# Patient Record
Sex: Male | Born: 1980 | Race: White | Hispanic: No | Marital: Married | Smoking: Never smoker
Health system: Southern US, Community
[De-identification: ages and names within clinical notes are randomized; demographics above are authoritative.]

## PROBLEM LIST (undated history)

## (undated) DIAGNOSIS — M1990 Unspecified osteoarthritis, unspecified site: Secondary | ICD-10-CM

## (undated) HISTORY — PX: COLONOSCOPY: SHX174

## (undated) HISTORY — PX: WISDOM TOOTH EXTRACTION: SHX21

## (undated) HISTORY — PX: VASECTOMY: SHX75

## (undated) HISTORY — PX: TONSILLECTOMY: SUR1361

---

## 2016-05-06 DIAGNOSIS — L299 Pruritus, unspecified: Secondary | ICD-10-CM | POA: Insufficient documentation

## 2016-05-06 DIAGNOSIS — J3089 Other allergic rhinitis: Secondary | ICD-10-CM | POA: Insufficient documentation

## 2016-05-06 DIAGNOSIS — T7840XA Allergy, unspecified, initial encounter: Secondary | ICD-10-CM | POA: Insufficient documentation

## 2016-05-06 DIAGNOSIS — T783XXA Angioneurotic edema, initial encounter: Secondary | ICD-10-CM | POA: Insufficient documentation

## 2016-12-17 ENCOUNTER — Ambulatory Visit
Admission: EM | Admit: 2016-12-17 | Discharge: 2016-12-17 | Disposition: A | Discharge status: Home / Self Care | Payer: BLUE CROSS/BLUE SHIELD | Attending: Family Medicine | Admitting: Family Medicine

## 2016-12-17 ENCOUNTER — Ambulatory Visit
Admission: RE | Admit: 2016-12-17 | Discharge: 2016-12-17 | Disposition: A | Discharge status: Home / Self Care | Payer: BLUE CROSS/BLUE SHIELD | Source: Ambulatory Visit | Attending: Family Medicine | Admitting: Family Medicine

## 2016-12-17 ENCOUNTER — Encounter: Payer: Self-pay | Admitting: Gynecology

## 2016-12-17 ENCOUNTER — Ambulatory Visit (INDEPENDENT_AMBULATORY_CARE_PROVIDER_SITE_OTHER): Payer: BLUE CROSS/BLUE SHIELD

## 2016-12-17 DIAGNOSIS — S20211A Contusion of right front wall of thorax, initial encounter: Secondary | ICD-10-CM

## 2016-12-17 DIAGNOSIS — R0782 Intercostal pain: Secondary | ICD-10-CM

## 2016-12-17 DIAGNOSIS — W19XXXA Unspecified fall, initial encounter: Secondary | ICD-10-CM | POA: Diagnosis not present

## 2016-12-17 DIAGNOSIS — R0789 Other chest pain: Secondary | ICD-10-CM

## 2016-12-17 MED ORDER — HYDROCODONE-ACETAMINOPHEN 5-325 MG PO TABS: ORAL_TABLET | ORAL | 0 refills | Status: DC

## 2016-12-17 NOTE — ED Provider Notes (Signed)
[facility]    CSN: [id number] Arrival date & time: 12/17/16  1608     History   Chief Complaint Chief Complaint  Patient presents with  . Rib Injury    HPI Jerry Henry is a 36 y.o. male.   36 yo male with a c/o right sided rib/chest pain after falling and injuring playing ball about 5 days ago. States pain is worse with movement or deep breathing.    The history is provided by the patient.    History reviewed. No pertinent past medical history.  There are no active problems to display for this patient.   Past Surgical History:  Procedure Laterality Date  . COLONOSCOPY    . TONSILLECTOMY    . WISDOM TOOTH EXTRACTION         Home Medications    Prior to Admission medications   Medication Sig Start Date End Date Taking? Authorizing Provider  HYDROcodone-acetaminophen (NORCO/VICODIN) 5-325 MG tablet 1-2 tabs po bid prn 12/17/16   Payton Mccallum, MD    Family History No family history on file.  Social History Social History  Substance Use Topics  . Smoking status: Never Smoker  . Smokeless tobacco: Never Used  . Alcohol use Yes     Allergies   Penicillins   Review of Systems Review of Systems   Physical Exam Triage Vital Signs ED Triage Vitals  Enc Vitals Group     BP 12/17/16 1637 131/83     Pulse Rate 12/17/16 1637 78     Resp 12/17/16 1637 16     Temp 12/17/16 1637 98.8 F (37.1 C)     Temp Source 12/17/16 1637 Oral     SpO2 12/17/16 1637 99 %     Weight 12/17/16 1634 210 lb (95.3 kg)     Height 12/17/16 1634 6' (1.829 m)     Head Circumference --      Peak Flow --      Pain Score 12/17/16 1634 7     Pain Loc --      Pain Edu? --      Excl. in GC? --    No data found.   Updated Vital Signs BP 131/83 (BP Location: Left Arm)   Pulse 78   Temp 98.8 F (37.1 C) (Oral)   Resp 16   Ht 6' (1.829 m)   Wt 210 lb (95.3 kg)   SpO2 99%   BMI 28.48 kg/m   Visual Acuity Right Eye Distance:   Left Eye Distance:     Bilateral Distance:    Right Eye Near:   Left Eye Near:    Bilateral Near:     Physical Exam  Constitutional: He appears well-developed and well-nourished.  Neck: No tracheal deviation present.  Cardiovascular: Normal rate, regular rhythm, normal heart sounds and intact distal pulses.   No murmur heard. Pulmonary/Chest: Effort normal and breath sounds normal. No stridor. No respiratory distress. He has no wheezes. He has no rales. He exhibits tenderness (over right anterior chest wall).  Skin: He is not diaphoretic.  Nursing note and vitals reviewed.    UC Treatments / Results  Labs (all labs ordered are listed, but only abnormal results are displayed) Labs Reviewed - No data to display  EKG  EKG Interpretation None       Radiology Dg Ribs Unilateral W/chest Right  Result Date: 12/17/2016 CLINICAL DATA:  Larey Seat 5 nights ago playing baseball and tripped over chair landing on right side of chest.  EXAM: RIGHT RIBS AND CHEST - 3+ VIEW COMPARISON:  None. FINDINGS: No fracture or other bone lesions are seen involving the ribs. There is no evidence of pneumothorax or pleural effusion. Both lungs are clear. Heart size and mediastinal contours are within normal limits. IMPRESSION: No acute displaced rib fracture.  Clear lungs. Electronically Signed   By: Tollie Ethavid  Kwon M.D.   On: 12/17/2016 17:39    Procedures Procedures (including critical care time)  Medications Ordered in UC Medications - No data to display   Initial Impression / Assessment and Plan / UC Course  I have reviewed the triage vital signs and the nursing notes.  Pertinent labs & imaging results that were available during my care of the patient were reviewed by me and considered in my medical decision making (see chart for details).       Final Clinical Impressions(s) / UC Diagnoses   Final diagnoses:  Contusion of right chest wall, initial encounter    New Prescriptions Discharge Medication List as of  12/17/2016  5:45 PM    START taking these medications   Details  HYDROcodone-acetaminophen (NORCO/VICODIN) 5-325 MG tablet 1-2 tabs po bid prn, Print       1. x-ray result (negative for acute injury) and diagnosis reviewed with patient 2. rx as per orders above; reviewed possible side effects, interactions, risks and benefits  3. Recommend supportive treatment with rest, ice, otc analgesics prn 4. Follow-up prn if symptoms worsen or don't improve   Payton Mccallumonty, Talor Desrosiers, MD 12/17/16 1827

## 2016-12-17 NOTE — ED Triage Notes (Signed)
Per patient playing base ball x 5 days when he fell and injury right side rib. Per patient right side rib pain.

## 2017-03-24 DIAGNOSIS — H60501 Unspecified acute noninfective otitis externa, right ear: Secondary | ICD-10-CM | POA: Insufficient documentation

## 2017-05-17 ENCOUNTER — Ambulatory Visit (INDEPENDENT_AMBULATORY_CARE_PROVIDER_SITE_OTHER): Payer: BLUE CROSS/BLUE SHIELD

## 2017-05-17 ENCOUNTER — Ambulatory Visit (INDEPENDENT_AMBULATORY_CARE_PROVIDER_SITE_OTHER): Payer: BLUE CROSS/BLUE SHIELD | Admitting: Podiatry

## 2017-05-17 VITALS — BP 118/78 | HR 72 | Ht 72.0 in | Wt 200.0 lb

## 2017-05-17 DIAGNOSIS — M109 Gout, unspecified: Secondary | ICD-10-CM | POA: Diagnosis not present

## 2017-05-17 LAB — URIC ACID: Uric Acid, Serum: 7.5 mg/dL (ref 4.0–8.0)

## 2017-05-17 NOTE — Progress Notes (Signed)
   Subjective:    Patient ID: Jerry Henry, male    DOB: 10/04/1980, 36 y.o.   MRN: [id number] Chief Complaint  Patient presents with  . Gout    right foot /great toe swollen and very painful -had first episode of gout at 20. Flared up again this past summer.Was treated at a different office - issue has not resolved       Review of Systems  All other systems reviewed and are negative.      Objective:   Physical Exam: Vital signs are stable he is alert and oriented x3 I have reviewed his past medical history medications allergy surgery and social history.  Pulses are strong palpable neurologic sensorium is intact.  Deep tendon reflexes are intact.  Muscle strength +5/5 dorsiflexors plantar flexors inverters and everters onto the musculature is intact and symmetrical bilateral.  Orthopedic evaluation does demonstrate limitation on range of motion of the first metatarsophalangeal joint of the right foot with mild erythema to the medial border which appears to be hypertrophic medial condyle.  It is mildly tender on palpation.  States is a history of gout.  Joint space narrowing is noted on radiographs some subchondral sclerosis is noted and dorsal spurring is noted.  Cutaneous evaluation no open lesions or wounds.  No tophi noted.        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  Plan: Today simply were going to try to get a baseline uric acid level send him out for uric acid to be drawn.  Follow-up with him in an as-needed basis.  He was instructed to follow-up with a doctor or to at the very least obtain a requisition for uric acid drawl should he develop another gout flare.

## 2017-10-19 ENCOUNTER — Other Ambulatory Visit: Payer: Self-pay | Admitting: Orthopedic Surgery

## 2017-10-21 NOTE — Pre-Procedure Instructions (Signed)
Victorhugo Preis  10/21/2017      [facility] - [place], [place] - [address] [place] [address] Phone: [phone] Fax: [phone]    Your procedure is scheduled on Oct 27, 2017.  Report to [facility] Admitting at 830 AM.  Call this number if you have problems the morning of surgery:[phone]   Remember:  No food or drink after midnight, Oct 26, 2017.  Continue all medications as directed by your physician except follow these medication instructions before surgery below    Take these medicines the morning of surgery with A SIP OF WATER  Cetirizine (zyrtec) Hydrocodone-acetaminophen (norco)-if needed prednisone  7 days prior to surgery STOP taking any Aspirin (unless otherwise instructed by your surgeon), Aleve, Naproxen, Ibuprofen, Motrin, Advil, Goody's, BC's, all herbal medications, fish oil, and all vitamins    Do not wear jewelry, make-up or nail polish.  Do not wear lotions, powders, or perfumes, or deodorant.  Men may shave face and neck.  Do not bring valuables to the hospital.  [facility] is not responsible for any belongings or valuables.  Contacts, dentures or bridgework may not be worn into surgery.  Leave your suitcase in the car.  After surgery it may be brought to your room.  For patients admitted to the hospital, discharge time will be determined by your treatment team.  Patients discharged the day of surgery will not be allowed to drive home.    [place]- Preparing For Surgery  Before surgery, you can play an important role. Because skin is not sterile, your skin needs to be as free of germs as possible. You can reduce the number of germs on your skin by washing with CHG (chlorahexidine gluconate) Soap before surgery.  CHG is an antiseptic cleaner which kills germs and bonds with the skin to continue killing germs even after washing.    Oral Hygiene is also important to reduce your risk of infection.   Remember - BRUSH YOUR TEETH THE MORNING OF SURGERY WITH YOUR REGULAR TOOTHPASTE  Please do not use if you have an allergy to CHG or antibacterial soaps. If your skin becomes reddened/irritated stop using the CHG.  Do not shave (including legs and underarms) for at least 48 hours prior to first CHG shower. It is OK to shave your face.  Please follow these instructions carefully.   1. Shower the NIGHT BEFORE SURGERY and the MORNING OF SURGERY with CHG.   2. If you chose to wash your hair, wash your hair first as usual with your normal shampoo.  3. After you shampoo, rinse your hair and body thoroughly to remove the shampoo.  4. Use CHG as you would any other liquid soap. You can apply CHG directly to the skin and wash gently with a scrungie or a clean washcloth.   5. Apply the CHG Soap to your body ONLY FROM THE NECK DOWN.  Do not use on open wounds or open sores. Avoid contact with your eyes, ears, mouth and genitals (private parts). Wash Face and genitals (private parts)  with your normal soap.  6. Wash thoroughly, paying special attention to the area where your surgery will be performed.  7. Thoroughly rinse your body with warm water from the neck down.  8. DO NOT shower/wash with your normal soap after using and rinsing off the CHG Soap.  9. Pat yourself dry with a CLEAN TOWEL.  10. Wear CLEAN PAJAMAS to  bed the night before surgery, wear comfortable clothes the morning of surgery  11. Place CLEAN SHEETS on your bed the night of your first shower and DO NOT SLEEP WITH PETS.  Day of Surgery:  Do not apply any deodorants/lotions.  Please wear clean clothes to the hospital/surgery center.   Remember to brush your teeth WITH YOUR REGULAR TOOTHPASTE.  Please read over the following fact sheets that you were given. Pain Booklet, Coughing and Deep Breathing and Surgical Site Infection Prevention

## 2017-10-21 NOTE — Progress Notes (Signed)
PCP: Dennis Bast, MD  Cardiologist:  EKG:  Stress test:  ECHO:  Cardiac Cath:  Chest x-ray

## 2017-10-25 ENCOUNTER — Encounter (HOSPITAL_COMMUNITY): Payer: Self-pay

## 2017-10-25 ENCOUNTER — Encounter (HOSPITAL_COMMUNITY)
Admission: RE | Admit: 2017-10-25 | Discharge: 2017-10-25 | Disposition: A | Discharge status: Home / Self Care | Payer: BLUE CROSS/BLUE SHIELD | Source: Ambulatory Visit | Attending: Orthopedic Surgery | Admitting: Orthopedic Surgery

## 2017-10-25 ENCOUNTER — Other Ambulatory Visit: Payer: Self-pay

## 2017-10-25 DIAGNOSIS — M79604 Pain in right leg: Secondary | ICD-10-CM | POA: Insufficient documentation

## 2017-10-25 DIAGNOSIS — Z01810 Encounter for preprocedural cardiovascular examination: Secondary | ICD-10-CM | POA: Insufficient documentation

## 2017-10-25 DIAGNOSIS — Z01818 Encounter for other preprocedural examination: Secondary | ICD-10-CM | POA: Diagnosis present

## 2017-10-25 HISTORY — DX: Unspecified osteoarthritis, unspecified site: M19.90

## 2017-10-25 LAB — APTT: aPTT: 26 seconds (ref 24–36)

## 2017-10-25 LAB — CBC WITH DIFFERENTIAL/PLATELET
ABS IMMATURE GRANULOCYTES: 0 10*3/uL (ref 0.0–0.1)
Basophils Absolute: 0 10*3/uL (ref 0.0–0.1)
Basophils Relative: 1 %
Eosinophils Absolute: 0.3 10*3/uL (ref 0.0–0.7)
Eosinophils Relative: 5 %
HCT: 45.8 % (ref 39.0–52.0)
HEMOGLOBIN: 15.4 g/dL (ref 13.0–17.0)
IMMATURE GRANULOCYTES: 0 %
LYMPHS PCT: 22 %
Lymphs Abs: 1.3 10*3/uL (ref 0.7–4.0)
MCH: 31.9 pg (ref 26.0–34.0)
MCHC: 33.6 g/dL (ref 30.0–36.0)
MCV: 94.8 fL (ref 78.0–100.0)
MONO ABS: 0.4 10*3/uL (ref 0.1–1.0)
MONOS PCT: 7 %
NEUTROS ABS: 3.9 10*3/uL (ref 1.7–7.7)
NEUTROS PCT: 65 %
PLATELETS: 198 10*3/uL (ref 150–400)
RBC: 4.83 MIL/uL (ref 4.22–5.81)
RDW: 11.9 % (ref 11.5–15.5)
WBC: 5.9 10*3/uL (ref 4.0–10.5)

## 2017-10-25 LAB — URINALYSIS, ROUTINE W REFLEX MICROSCOPIC
BACTERIA UA: NONE SEEN
Bilirubin Urine: NEGATIVE
Glucose, UA: NEGATIVE mg/dL
Hgb urine dipstick: NEGATIVE
Ketones, ur: NEGATIVE mg/dL
LEUKOCYTES UA: NEGATIVE
Nitrite: NEGATIVE
PROTEIN: NEGATIVE mg/dL
SPECIFIC GRAVITY, URINE: 1.002 — AB (ref 1.005–1.030)
pH: 8 (ref 5.0–8.0)

## 2017-10-25 LAB — COMPREHENSIVE METABOLIC PANEL
ALK PHOS: 41 U/L (ref 38–126)
ALT: 16 U/L — AB (ref 17–63)
AST: 19 U/L (ref 15–41)
Albumin: 4 g/dL (ref 3.5–5.0)
Anion gap: 8 (ref 5–15)
BUN: 13 mg/dL (ref 6–20)
CALCIUM: 9.3 mg/dL (ref 8.9–10.3)
CHLORIDE: 105 mmol/L (ref 101–111)
CO2: 27 mmol/L (ref 22–32)
CREATININE: 0.93 mg/dL (ref 0.61–1.24)
Glucose, Bld: 96 mg/dL (ref 65–99)
Potassium: 4 mmol/L (ref 3.5–5.1)
SODIUM: 140 mmol/L (ref 135–145)
Total Bilirubin: 1.6 mg/dL — ABNORMAL HIGH (ref 0.3–1.2)
Total Protein: 6.4 g/dL — ABNORMAL LOW (ref 6.5–8.1)

## 2017-10-25 LAB — PROTIME-INR
INR: 1.12
Prothrombin Time: 14.3 seconds (ref 11.4–15.2)

## 2017-10-25 LAB — SURGICAL PCR SCREEN
MRSA, PCR: NEGATIVE
STAPHYLOCOCCUS AUREUS: POSITIVE — AB

## 2017-10-25 NOTE — Progress Notes (Signed)
PCP: Dennis Bast, MD  Cardiologist: pt denies  EKG: 10/25/17 per order  Stress test: pt denies  ECHO: pt denies  Cardiac Cath: pt denies  Chest x-ray:10/25/17 per order

## 2017-10-26 NOTE — Anesthesia Preprocedure Evaluation (Addendum)
Anesthesia Evaluation  Patient identified by MRN, date of birth, ID band Patient awake    Reviewed: Allergy & Precautions, NPO status , Patient's Chart, lab work & pertinent test results  Airway Mallampati: II  TM Distance: >3 FB Neck ROM: Full    Dental no notable dental hx. (+) Teeth Intact, Dental Advisory Given   Pulmonary neg pulmonary ROS,    Pulmonary exam normal breath sounds clear to auscultation       Cardiovascular Exercise Tolerance: Good negative cardio ROS Normal cardiovascular exam Rhythm:Regular Rate:Normal     Neuro/Psych negative neurological ROS  negative psych ROS   GI/Hepatic negative GI ROS, Neg liver ROS,   Endo/Other  negative endocrine ROS  Renal/GU negative Renal ROS     Musculoskeletal   Abdominal   Peds  Hematology   Anesthesia Other Findings   Reproductive/Obstetrics                             Anesthesia Physical Anesthesia Plan  ASA: I  Anesthesia Plan: General   Post-op Pain Management:    Induction: Intravenous  PONV Risk Score and Plan: 2 and Treatment may vary due to age or medical condition and Ondansetron  Airway Management Planned: Oral ETT  Additional Equipment:   Intra-op Plan:   Post-operative Plan: Extubation in OR  Informed Consent: I have reviewed the patients History and Physical, chart, labs and discussed the procedure including the risks, benefits and alternatives for the proposed anesthesia with the patient or authorized representative who has indicated his/her understanding and acceptance.   Dental advisory given  Plan Discussed with: CRNA  Anesthesia Plan Comments:         Anesthesia Quick Evaluation

## 2017-10-27 ENCOUNTER — Ambulatory Visit (HOSPITAL_COMMUNITY): Payer: BLUE CROSS/BLUE SHIELD

## 2017-10-27 ENCOUNTER — Encounter (HOSPITAL_COMMUNITY): Payer: Self-pay | Admitting: *Deleted

## 2017-10-27 ENCOUNTER — Encounter (HOSPITAL_COMMUNITY)
Admission: RE | Disposition: A | Discharge status: Home / Self Care | Payer: Self-pay | Source: Ambulatory Visit | Attending: Orthopedic Surgery

## 2017-10-27 ENCOUNTER — Ambulatory Visit (HOSPITAL_COMMUNITY): Payer: BLUE CROSS/BLUE SHIELD | Admitting: Anesthesiology

## 2017-10-27 ENCOUNTER — Ambulatory Visit (HOSPITAL_COMMUNITY)
Admission: RE | Admit: 2017-10-27 | Discharge: 2017-10-27 | Disposition: A | Discharge status: Home / Self Care | Payer: BLUE CROSS/BLUE SHIELD | Source: Ambulatory Visit | Attending: Orthopedic Surgery | Admitting: Orthopedic Surgery

## 2017-10-27 DIAGNOSIS — M1990 Unspecified osteoarthritis, unspecified site: Secondary | ICD-10-CM | POA: Insufficient documentation

## 2017-10-27 DIAGNOSIS — Z419 Encounter for procedure for purposes other than remedying health state, unspecified: Secondary | ICD-10-CM

## 2017-10-27 DIAGNOSIS — M5117 Intervertebral disc disorders with radiculopathy, lumbosacral region: Secondary | ICD-10-CM | POA: Insufficient documentation

## 2017-10-27 DIAGNOSIS — Z880 Allergy status to penicillin: Secondary | ICD-10-CM | POA: Insufficient documentation

## 2017-10-27 HISTORY — PX: LUMBAR LAMINECTOMY/DECOMPRESSION MICRODISCECTOMY: SHX5026

## 2017-10-27 SURGERY — LUMBAR LAMINECTOMY/DECOMPRESSION MICRODISCECTOMY
Anesthesia: General

## 2017-10-27 MED ORDER — PROMETHAZINE HCL 25 MG/ML IJ SOLN
INTRAMUSCULAR | Status: AC
  Filled 2017-10-27: qty 1

## 2017-10-27 MED ORDER — LIDOCAINE 2% (20 MG/ML) 5 ML SYRINGE
INTRAMUSCULAR | Status: DC | PRN
  Administered 2017-10-27: 60 mg via INTRAVENOUS

## 2017-10-27 MED ORDER — ONDANSETRON HCL 4 MG/2ML IJ SOLN
INTRAMUSCULAR | Status: AC
  Filled 2017-10-27: qty 2

## 2017-10-27 MED ORDER — 0.9 % SODIUM CHLORIDE (POUR BTL) OPTIME
TOPICAL | Status: DC | PRN
  Administered 2017-10-27: 1000 mL

## 2017-10-27 MED ORDER — HYDROMORPHONE HCL 2 MG/ML IJ SOLN
0.2500 mg | INTRAMUSCULAR | Status: DC | PRN
  Administered 2017-10-27: 0.3 mg via INTRAVENOUS

## 2017-10-27 MED ORDER — ROCURONIUM BROMIDE 10 MG/ML (PF) SYRINGE
PREFILLED_SYRINGE | INTRAVENOUS | Status: DC | PRN
  Administered 2017-10-27: 50 mg via INTRAVENOUS
  Administered 2017-10-27: 10 mg via INTRAVENOUS
  Administered 2017-10-27: 30 mg via INTRAVENOUS
  Administered 2017-10-27: 20 mg via INTRAVENOUS
  Administered 2017-10-27: 10 mg via INTRAVENOUS

## 2017-10-27 MED ORDER — SUGAMMADEX SODIUM 200 MG/2ML IV SOLN
INTRAVENOUS | Status: AC
  Filled 2017-10-27: qty 2

## 2017-10-27 MED ORDER — POVIDONE-IODINE 7.5 % EX SOLN
Freq: Once | CUTANEOUS | Status: DC
  Filled 2017-10-27: qty 118

## 2017-10-27 MED ORDER — MIDAZOLAM HCL 5 MG/5ML IJ SOLN
INTRAMUSCULAR | Status: DC | PRN
  Administered 2017-10-27: 2 mg via INTRAVENOUS

## 2017-10-27 MED ORDER — PROMETHAZINE HCL 25 MG/ML IJ SOLN
6.2500 mg | INTRAMUSCULAR | Status: AC | PRN
  Administered 2017-10-27 (×2): 6.25 mg via INTRAVENOUS

## 2017-10-27 MED ORDER — BUPIVACAINE-EPINEPHRINE 0.25% -1:200000 IJ SOLN
INTRAMUSCULAR | Status: DC | PRN
  Administered 2017-10-27: 10 mL

## 2017-10-27 MED ORDER — ONDANSETRON HCL 4 MG/2ML IJ SOLN
INTRAMUSCULAR | Status: DC | PRN
  Administered 2017-10-27: 4 mg via INTRAVENOUS

## 2017-10-27 MED ORDER — THROMBIN (RECOMBINANT) 5000 UNITS EX SOLR
CUTANEOUS | Status: AC
  Filled 2017-10-27: qty 5000

## 2017-10-27 MED ORDER — SUGAMMADEX SODIUM 200 MG/2ML IV SOLN
INTRAVENOUS | Status: DC | PRN
  Administered 2017-10-27: 165.4 mg via INTRAVENOUS

## 2017-10-27 MED ORDER — HYDROMORPHONE HCL 2 MG/ML IJ SOLN
INTRAMUSCULAR | Status: AC
  Filled 2017-10-27: qty 1

## 2017-10-27 MED ORDER — HEMOSTATIC AGENTS (NO CHARGE) OPTIME
TOPICAL | Status: DC | PRN
  Administered 2017-10-27: 1 via TOPICAL

## 2017-10-27 MED ORDER — SUGAMMADEX SODIUM 500 MG/5ML IV SOLN
INTRAVENOUS | Status: AC
  Filled 2017-10-27: qty 5

## 2017-10-27 MED ORDER — METHYLENE BLUE 0.5 % INJ SOLN
INTRAVENOUS | Status: AC
  Filled 2017-10-27: qty 10

## 2017-10-27 MED ORDER — FENTANYL CITRATE (PF) 250 MCG/5ML IJ SOLN
INTRAMUSCULAR | Status: DC | PRN
  Administered 2017-10-27: 100 ug via INTRAVENOUS

## 2017-10-27 MED ORDER — HYDROCODONE-ACETAMINOPHEN 7.5-325 MG PO TABS
1.0000 | ORAL_TABLET | Freq: Once | ORAL | Status: AC | PRN
  Administered 2017-10-27: 1 via ORAL

## 2017-10-27 MED ORDER — BUPIVACAINE-EPINEPHRINE (PF) 0.25% -1:200000 IJ SOLN
INTRAMUSCULAR | Status: AC
  Filled 2017-10-27: qty 30

## 2017-10-27 MED ORDER — CLONIDINE HCL 0.2 MG PO TABS
0.2000 mg | ORAL_TABLET | Freq: Once | ORAL | Status: AC
  Administered 2017-10-27: 0.2 mg via ORAL
  Filled 2017-10-27: qty 1

## 2017-10-27 MED ORDER — GABAPENTIN 300 MG PO CAPS
300.0000 mg | ORAL_CAPSULE | Freq: Once | ORAL | Status: AC
  Administered 2017-10-27: 300 mg via ORAL
  Filled 2017-10-27: qty 1

## 2017-10-27 MED ORDER — BUPIVACAINE LIPOSOME 1.3 % IJ SUSP
INTRAMUSCULAR | Status: DC | PRN
  Administered 2017-10-27: 10 mL

## 2017-10-27 MED ORDER — LIDOCAINE 2% (20 MG/ML) 5 ML SYRINGE
INTRAMUSCULAR | Status: AC
  Filled 2017-10-27: qty 5

## 2017-10-27 MED ORDER — PHENYLEPHRINE 40 MCG/ML (10ML) SYRINGE FOR IV PUSH (FOR BLOOD PRESSURE SUPPORT)
PREFILLED_SYRINGE | INTRAVENOUS | Status: AC
  Filled 2017-10-27: qty 10

## 2017-10-27 MED ORDER — MEPERIDINE HCL 50 MG/ML IJ SOLN: 6.2500 mg | INTRAMUSCULAR | Status: DC | PRN

## 2017-10-27 MED ORDER — FENTANYL CITRATE (PF) 250 MCG/5ML IJ SOLN
INTRAMUSCULAR | Status: AC
  Filled 2017-10-27: qty 5

## 2017-10-27 MED ORDER — METHYLPREDNISOLONE ACETATE 40 MG/ML IJ SUSP
INTRAMUSCULAR | Status: DC | PRN
  Administered 2017-10-27: 40 mg

## 2017-10-27 MED ORDER — METHYLPREDNISOLONE ACETATE 40 MG/ML IJ SUSP
INTRAMUSCULAR | Status: AC
  Filled 2017-10-27: qty 1

## 2017-10-27 MED ORDER — PROPOFOL 10 MG/ML IV BOLUS
INTRAVENOUS | Status: AC
  Filled 2017-10-27: qty 20

## 2017-10-27 MED ORDER — THROMBIN 5000 UNITS EX SOLR
CUTANEOUS | Status: AC
  Filled 2017-10-27: qty 10000

## 2017-10-27 MED ORDER — ROCURONIUM BROMIDE 10 MG/ML (PF) SYRINGE
PREFILLED_SYRINGE | INTRAVENOUS | Status: AC
  Filled 2017-10-27: qty 5

## 2017-10-27 MED ORDER — BUPIVACAINE LIPOSOME 1.3 % IJ SUSP
20.0000 mL | Freq: Once | INTRAMUSCULAR | Status: DC
  Filled 2017-10-27: qty 20

## 2017-10-27 MED ORDER — METHYLENE BLUE 0.5 % INJ SOLN
INTRAVENOUS | Status: DC | PRN
  Administered 2017-10-27: 1 mL

## 2017-10-27 MED ORDER — LACTATED RINGERS IV SOLN
INTRAVENOUS | Status: DC
  Administered 2017-10-27 (×2): via INTRAVENOUS

## 2017-10-27 MED ORDER — ACETAMINOPHEN 10 MG/ML IV SOLN: 1000.0000 mg | Freq: Once | INTRAVENOUS | Status: DC | PRN

## 2017-10-27 MED ORDER — PROPOFOL 10 MG/ML IV BOLUS
INTRAVENOUS | Status: DC | PRN
  Administered 2017-10-27: 200 mg via INTRAVENOUS

## 2017-10-27 MED ORDER — MIDAZOLAM HCL 2 MG/2ML IJ SOLN
INTRAMUSCULAR | Status: AC
  Filled 2017-10-27: qty 2

## 2017-10-27 MED ORDER — PHENYLEPHRINE 40 MCG/ML (10ML) SYRINGE FOR IV PUSH (FOR BLOOD PRESSURE SUPPORT)
PREFILLED_SYRINGE | INTRAVENOUS | Status: DC | PRN
  Administered 2017-10-27: 120 ug via INTRAVENOUS
  Administered 2017-10-27 (×6): 80 ug via INTRAVENOUS
  Administered 2017-10-27: 120 ug via INTRAVENOUS
  Administered 2017-10-27: 80 ug via INTRAVENOUS

## 2017-10-27 MED ORDER — THROMBIN 5000 UNITS EX SOLR
CUTANEOUS | Status: DC | PRN
  Administered 2017-10-27 (×2): 5000 [IU] via TOPICAL

## 2017-10-27 MED ORDER — ACETAMINOPHEN 500 MG PO TABS
1000.0000 mg | ORAL_TABLET | Freq: Once | ORAL | Status: AC
  Administered 2017-10-27: 1000 mg via ORAL
  Filled 2017-10-27: qty 2

## 2017-10-27 MED ORDER — VANCOMYCIN HCL IN DEXTROSE 1-5 GM/200ML-% IV SOLN
1000.0000 mg | INTRAVENOUS | Status: AC
  Administered 2017-10-27: 1000 mg via INTRAVENOUS
  Filled 2017-10-27: qty 200

## 2017-10-27 MED ORDER — HYDROCODONE-ACETAMINOPHEN 7.5-325 MG PO TABS
ORAL_TABLET | ORAL | Status: AC
  Administered 2017-10-27: 16:00:00
  Filled 2017-10-27: qty 1

## 2017-10-27 MED ORDER — EPHEDRINE SULFATE 50 MG/ML IJ SOLN
INTRAMUSCULAR | Status: DC | PRN
  Administered 2017-10-27 (×2): 5 mg via INTRAVENOUS
  Administered 2017-10-27: 10 mg via INTRAVENOUS

## 2017-10-27 MED ORDER — EPHEDRINE SULFATE 50 MG/ML IJ SOLN
INTRAMUSCULAR | Status: AC
  Filled 2017-10-27: qty 1

## 2017-10-27 SURGICAL SUPPLY — 78 items
BENZOIN TINCTURE PRP APPL 2/3 (GAUZE/BANDAGES/DRESSINGS) ×3 IMPLANT
BUR PRECISION FLUTE 5.0 (BURR) ×3 IMPLANT
BUR ROUND PRECISION 4.0 (BURR) IMPLANT
BUR ROUND PRECISION 4.0MM (BURR)
CANISTER SUCT 3000ML PPV (MISCELLANEOUS) ×3 IMPLANT
CARTRIDGE OIL MAESTRO DRILL (MISCELLANEOUS) ×1 IMPLANT
CLOSURE STERI-STRIP 1/2X4 (GAUZE/BANDAGES/DRESSINGS) ×1
CLOSURE WOUND 1/2 X4 (GAUZE/BANDAGES/DRESSINGS)
CLSR STERI-STRIP ANTIMIC 1/2X4 (GAUZE/BANDAGES/DRESSINGS) ×2 IMPLANT
CORDS BIPOLAR (ELECTRODE) ×3 IMPLANT
COVER SURGICAL LIGHT HANDLE (MISCELLANEOUS) ×3 IMPLANT
DIFFUSER DRILL AIR PNEUMATIC (MISCELLANEOUS) ×3 IMPLANT
DRAIN CHANNEL 15F RND FF W/TCR (WOUND CARE) IMPLANT
DRAPE POUCH INSTRU U-SHP 10X18 (DRAPES) ×6 IMPLANT
DRAPE SURG 17X23 STRL (DRAPES) ×12 IMPLANT
DURAPREP 26ML APPLICATOR (WOUND CARE) ×3 IMPLANT
ELECT BLADE 4.0 EZ CLEAN MEGAD (MISCELLANEOUS) ×3
ELECT CAUTERY BLADE 6.4 (BLADE) ×3 IMPLANT
ELECT REM PT RETURN 9FT ADLT (ELECTROSURGICAL) ×3
ELECTRODE BLDE 4.0 EZ CLN MEGD (MISCELLANEOUS) ×1 IMPLANT
ELECTRODE REM PT RTRN 9FT ADLT (ELECTROSURGICAL) ×1 IMPLANT
EVACUATOR SILICONE 100CC (DRAIN) IMPLANT
FILTER STRAW FLUID ASPIR (MISCELLANEOUS) ×3 IMPLANT
GAUZE SPONGE 4X4 12PLY STRL (GAUZE/BANDAGES/DRESSINGS) ×3 IMPLANT
GAUZE SPONGE 4X4 16PLY XRAY LF (GAUZE/BANDAGES/DRESSINGS) ×6 IMPLANT
GLOVE BIO SURGEON STRL SZ7 (GLOVE) ×3 IMPLANT
GLOVE BIO SURGEON STRL SZ8 (GLOVE) ×3 IMPLANT
GLOVE BIOGEL PI IND STRL 7.0 (GLOVE) ×1 IMPLANT
GLOVE BIOGEL PI IND STRL 8 (GLOVE) ×2 IMPLANT
GLOVE BIOGEL PI INDICATOR 7.0 (GLOVE) ×2
GLOVE BIOGEL PI INDICATOR 8 (GLOVE) ×4
GOWN STRL REUS W/ TWL LRG LVL3 (GOWN DISPOSABLE) ×1 IMPLANT
GOWN STRL REUS W/ TWL XL LVL3 (GOWN DISPOSABLE) ×2 IMPLANT
GOWN STRL REUS W/TWL LRG LVL3 (GOWN DISPOSABLE) ×2
GOWN STRL REUS W/TWL XL LVL3 (GOWN DISPOSABLE) ×4
IV CATH 14GX2 1/4 (CATHETERS) ×3 IMPLANT
KIT BASIN OR (CUSTOM PROCEDURE TRAY) ×3 IMPLANT
KIT POSITION SURG JACKSON T1 (MISCELLANEOUS) ×3 IMPLANT
KIT TURNOVER KIT B (KITS) ×3 IMPLANT
MARKER SKIN DUAL TIP RULER LAB (MISCELLANEOUS) ×6 IMPLANT
NEEDLE 18GX1X1/2 (RX/OR ONLY) (NEEDLE) ×3 IMPLANT
NEEDLE 22X1 1/2 (OR ONLY) (NEEDLE) ×3 IMPLANT
NEEDLE HYPO 25GX1X1/2 BEV (NEEDLE) ×3 IMPLANT
NEEDLE SPNL 18GX3.5 QUINCKE PK (NEEDLE) ×6 IMPLANT
NS IRRIG 1000ML POUR BTL (IV SOLUTION) ×3 IMPLANT
OIL CARTRIDGE MAESTRO DRILL (MISCELLANEOUS) ×3
PACK LAMINECTOMY ORTHO (CUSTOM PROCEDURE TRAY) ×3 IMPLANT
PACK UNIVERSAL I (CUSTOM PROCEDURE TRAY) ×3 IMPLANT
PAD ARMBOARD 7.5X6 YLW CONV (MISCELLANEOUS) ×6 IMPLANT
PATTIES SURGICAL .5 X.5 (GAUZE/BANDAGES/DRESSINGS) IMPLANT
PATTIES SURGICAL .5 X1 (DISPOSABLE) ×3 IMPLANT
PEN SKIN MARKING BROAD (MISCELLANEOUS) ×3 IMPLANT
SPONGE INTESTINAL PEANUT (DISPOSABLE) ×3 IMPLANT
SPONGE SURGIFOAM ABS GEL 100 (HEMOSTASIS) ×3 IMPLANT
SPONGE SURGIFOAM ABS GEL SZ50 (HEMOSTASIS) ×3 IMPLANT
STRIP CLOSURE SKIN 1/2X4 (GAUZE/BANDAGES/DRESSINGS) IMPLANT
SUCTION FRAZIER TIP 8 FR DISP (SUCTIONS) ×2
SUCTION TUBE FRAZIER 8FR DISP (SUCTIONS) ×1 IMPLANT
SURGIFLO W/THROMBIN 8M KIT (HEMOSTASIS) IMPLANT
SUT MNCRL AB 4-0 PS2 18 (SUTURE) ×3 IMPLANT
SUT VIC AB 0 CT1 18XCR BRD 8 (SUTURE) IMPLANT
SUT VIC AB 0 CT1 27 (SUTURE)
SUT VIC AB 0 CT1 27XBRD ANBCTR (SUTURE) IMPLANT
SUT VIC AB 0 CT1 8-18 (SUTURE)
SUT VIC AB 1 CT1 18XCR BRD 8 (SUTURE) ×1 IMPLANT
SUT VIC AB 1 CT1 8-18 (SUTURE) ×2
SUT VIC AB 2-0 CT2 18 VCP726D (SUTURE) ×3 IMPLANT
SYR 20CC LL (SYRINGE) ×3 IMPLANT
SYR BULB IRRIGATION 50ML (SYRINGE) ×3 IMPLANT
SYR CONTROL 10ML LL (SYRINGE) ×6 IMPLANT
SYR TB 1ML 26GX3/8 SAFETY (SYRINGE) ×6 IMPLANT
SYR TB 1ML LUER SLIP (SYRINGE) ×6 IMPLANT
TAPE CLOTH SURG 4X10 WHT LF (GAUZE/BANDAGES/DRESSINGS) ×3 IMPLANT
TAPE UMBILICAL COTTON 1/8X30 (MISCELLANEOUS) ×3 IMPLANT
TOWEL OR 17X24 6PK STRL BLUE (TOWEL DISPOSABLE) ×3 IMPLANT
TOWEL OR 17X26 10 PK STRL BLUE (TOWEL DISPOSABLE) ×3 IMPLANT
WATER STERILE IRR 1000ML POUR (IV SOLUTION) ×3 IMPLANT
YANKAUER SUCT BULB TIP NO VENT (SUCTIONS) ×3 IMPLANT

## 2017-10-27 NOTE — Op Note (Signed)
NAME: Jerry Henry           MEDICAL RECORD NO.:   [id number]  PHYSICIAN:  Estill Bamberg, MD      DATE OF BIRTH:   02-02-1981  DATE OF PROCEDURE:   10/27/2017                              OPERATIVE REPORT   PREOPERATIVE DIAGNOSES: 1.  Chronic right-sided S1 radiculopathy. 2.  Chronic right-sided L5-S1 disk herniation causing     compression of the right S1 nerve.  POSTOPERATIVE DIAGNOSES: 1.  Chronic right-sided S1 radiculopathy. 2.  Chronic right-sided L5-S1 disk herniation causing     compression of the right S1 nerve.  PROCEDURES:  Right-sided L5-S1 laminotomy with partial facetectomy and removal of chronic herniated right-sided L5-S1 disk fragment.  SURGEON:  Estill Bamberg, MD.  ASSISTANTJason Coop, PA-C.  ANESTHESIA:  General endotracheal anesthesia.  COMPLICATIONS:  None.  DISPOSITION:  Stable.  ESTIMATED BLOOD LOSS:  Minimal.  INDICATIONS FOR SURGERY:  Briefly, Jerry Henry is a pleasant 37 year old Male, who did present to me w ongoing pain in the right leg.    The patient's symptoms were chronic, and had been present for about a year and a half.  The patient's MRI did reveal the findings outlined above, clearly notable for a prominent broad-based right-sided L5-S1 herniated disk fragment.  Of note, this MRI was compared to an older MRI, and was slightly worse, but largely unchanged, identifying the herniation is a chronic one.  We did discuss treatment options and we did ultimately elect to proceed with the procedure reflected above.  The patient was fully made aware of the risks of surgery, including the risk of recurrent herniation and the need for subsequent surgery, including the possibility of a subsequent diskectomy and/or fusion.  OPERATIVE DETAILS:  On 10/27/2017, the patient was brought to surgery and general endotracheal anesthesia was administered.  The patient was placed prone on a well-padded flat Jackson bed with a spinal frame.   Antibiotics were given.  The back was prepped and draped and a time-out procedure was performed.  At this point, a midline incision was made directly over the L5-S1 intervertebral space.  A curvilinear incision was made just to the right of the midline into the fascia.  A self-retaining McCulloch retractor was placed.  The lamina of L5 and S1 was identified and subperiosteally exposed.  I then removed the lateral aspect of the L5-S1 ligamentum flavum.  Readily identified was the traversing right S1 nerve, which was noted to be under obvious tension, and was noted to be rather erythematous.  I was able to gently gain gentle medial retraction of the nerve, and in doing so, a very prominent broad-based disk fragment was readily noted.  Of note, the herniated disc fragments themselves were adherent to the annulus, and in order to provide a thorough decompression of the nerve, an annulotomy was made, and I did use a series of upward and downward angled micropituitaries.  In doing so, I was able to entirely decompress the traversing right S1 nerve.  A minimal amount of annulus also did need to be removed, again, in order to ensure adequate decompression of the traversing right S1 nerve.  Additional minimally protruding annular fragments were noted, but were noncompressive, and I did not feel that any additional disruption of the intervertebral disc or annulus would have any gain associated with it, and I did feel  would additionally increase the risk of recurrent herniation.  I was very pleased with the final decompression noted. At this point, the wound was copiously irrigated with normal saline.  All epidural bleeding was controlled using bipolar electrocautery in addition to Surgiflo. All bleeding was controlled at the termination of the procedure.  At this point, 20 mg of Depo-Medrol was introduced about the epidural space in the region of the right S1 nerve.  The wound was then closed in layers using #1  Vicryl followed by 0 Vicryl, followed by 4-0 Monocryl. Benzoin and Steri-Strips were applied followed by a sterile dressing. All instrument counts were correct at the termination of the procedure.  Of note, Jason Coop was my assistant throughout surgery, and did aid in retraction, suctioning, and closure from start to finish.     Estill Bamberg, MD

## 2017-10-27 NOTE — H&P (Signed)
PREOPERATIVE H&P  Chief Complaint: Right leg pain  HPI: Jerry Henry is a 37 y.o. male who presents with ongoing chronic pain in the right leg  MRI reveals a chonic left L5/S1 HNP compressing the right S1 nerve  Patient has failed multiple forms of conservative care and continues to have pain (see office notes for additional details regarding the patient's full course of treatment)  Past Medical History:  Diagnosis Date  . Arthritis    Past Surgical History:  Procedure Laterality Date  . COLONOSCOPY    . TONSILLECTOMY    . VASECTOMY    . WISDOM TOOTH EXTRACTION     Social History   Socioeconomic History  . Marital status: Married    Spouse name: Not on file  . Number of children: Not on file  . Years of education: Not on file  . Highest education level: Not on file  Occupational History  . Not on file  Social Needs  . Financial resource strain: Not on file  . Food insecurity:    Worry: Not on file    Inability: Not on file  . Transportation needs:    Medical: Not on file    Non-medical: Not on file  Tobacco Use  . Smoking status: Never Smoker  . Smokeless tobacco: Never Used  Substance and Sexual Activity  . Alcohol use: Yes    Comment: socially  . Drug use: Never  . Sexual activity: Not on file  Lifestyle  . Physical activity:    Days per week: Not on file    Minutes per session: Not on file  . Stress: Not on file  Relationships  . Social connections:    Talks on phone: Not on file    Gets together: Not on file    Attends religious service: Not on file    Active member of club or organization: Not on file    Attends meetings of clubs or organizations: Not on file    Relationship status: Not on file  Other Topics Concern  . Not on file  Social History Narrative  . Not on file   Family History  Problem Relation Age of Onset  . Stroke Mother   . Stroke Father    Allergies  Allergen Reactions  . Penicillins Hives    Has patient had a PCN  reaction causing immediate rash, facial/tongue/throat swelling, SOB or lightheadedness with hypotension: No Had a PCN reaction causing SEVERE RASH involving MUCUS MEMEBRANES or SKIN NECROSIS: #  #  #  YES  #  #  #  Has patient had a PCN reaction that required hospitalization: Unknown Has patient had a PCN reaction occurring within the last 10 years: No    Prior to Admission medications   Medication Sig Start Date End Date Taking? Authorizing Provider  cetirizine (ZYRTEC) 10 MG tablet Take 5 mg by mouth at bedtime.   Yes [provider]  Fiber, Guar Gum, CHEW Chew 1 tablet by mouth daily.   Yes [provider]  HYDROcodone-acetaminophen (NORCO/VICODIN) 5-325 MG tablet 1-2 tabs po bid prn Patient taking differently: Take 1 tablet by mouth daily as needed for moderate pain.  12/17/16  Yes Payton Mccallum, MD  ibuprofen (ADVIL,MOTRIN) 200 MG tablet Take 400 mg by mouth 2 (two) times daily as needed for moderate pain.   Yes [provider]  Multiple Vitamin (MULTIVITAMIN WITH MINERALS) TABS tablet Take 1 tablet by mouth daily.   Yes [provider]  predniSONE (STERAPRED UNI-PAK 21 TAB) 5 MG (21) TBPK tablet Take 10 mg by mouth daily. 10/07/17  Yes [provider]     All other systems have been reviewed and were otherwise negative with the exception of those mentioned in the HPI and as above.  Physical Exam: There were no vitals filed for this visit.  There is no height or weight on file to calculate BMI.  General: Alert, no acute distress Cardiovascular: No pedal edema Respiratory: No cyanosis, no use of accessory musculature Skin: No lesions in the area of chief complaint Neurologic: Sensation intact distally Psychiatric: Patient is competent for consent with normal mood and affect Lymphatic: No axillary or cervical lymphadenopathy  MUSCULOSKELETAL: + SLR on the right  Assessment/Plan: LEG PAIN Plan for Procedure(s): RIGHT SIDED LUMBAR 5  SACRUM 1 MICRODISECTOMY.     Emilee Hero, MD 10/27/2017 6:33 AM

## 2017-10-27 NOTE — Transfer of Care (Signed)
Immediate Anesthesia Transfer of Care Note  Patient: Jerry Henry  Procedure(s) Performed: RIGHT SIDED LUMBAR 5 SACRUM 1 MICRODISECTOMY.  TIME REQUESTED 2.5 HOURS (N/A )  Patient Location: PACU  Anesthesia Type:General  Level of Consciousness: drowsy and patient cooperative  Airway & Oxygen Therapy: Patient Spontanous Breathing and Patient connected to face mask oxygen  Post-op Assessment: Report given to RN and Post -op Vital signs reviewed and stable  Post vital signs: Reviewed and stable  Last Vitals:  Vitals Value Taken Time  BP 119/67 10/27/2017  2:50 PM  Temp    Pulse 86 10/27/2017  2:52 PM  Resp 21 10/27/2017  2:52 PM  SpO2 100 % 10/27/2017  2:52 PM  Vitals shown include unvalidated device data.  Last Pain:  Vitals:   10/27/17 0912  TempSrc:   PainSc: 4       Patients Stated Pain Goal: 4 (10/27/17 0912)  Complications: No apparent anesthesia complications

## 2017-10-27 NOTE — Anesthesia Procedure Notes (Signed)
Procedure Name: Intubation Date/Time: 10/27/2017 11:45 AM Performed by: Freddie Breech, CRNA Pre-anesthesia Checklist: Patient identified, Emergency Drugs available, Suction available and Patient being monitored Patient Re-evaluated:Patient Re-evaluated prior to induction Oxygen Delivery Method: Circle System Utilized Preoxygenation: Pre-oxygenation with 100% oxygen Induction Type: IV induction Ventilation: Mask ventilation without difficulty Laryngoscope Size: Mac and 4 Grade View: Grade I Tube type: Oral Tube size: 7.0 mm Number of attempts: 1 Airway Equipment and Method: Stylet and Oral airway Placement Confirmation: ETT inserted through vocal cords under direct vision,  positive ETCO2 and breath sounds checked- equal and bilateral Secured at: 23 cm Tube secured with: Tape Dental Injury: Teeth and Oropharynx as per pre-operative assessment

## 2017-10-28 ENCOUNTER — Encounter (HOSPITAL_COMMUNITY): Payer: Self-pay | Admitting: Orthopedic Surgery

## 2017-10-28 NOTE — Anesthesia Postprocedure Evaluation (Signed)
Anesthesia Post Note  Patient: Jerry Henry  Procedure(s) Performed: RIGHT SIDED LUMBAR 5 SACRUM 1 MICRODISECTOMY.  TIME REQUESTED 2.5 HOURS (N/A )     Patient location during evaluation: PACU Anesthesia Type: General Level of consciousness: awake and alert Pain management: pain level controlled Vital Signs Assessment: post-procedure vital signs reviewed and stable Respiratory status: spontaneous breathing, nonlabored ventilation, respiratory function stable and patient connected to nasal cannula oxygen Cardiovascular status: blood pressure returned to baseline and stable Postop Assessment: no apparent nausea or vomiting Anesthetic complications: no    Last Vitals:  Vitals:   10/27/17 1718 10/27/17 1730  BP: (!) 115/58 (!) 113/53  Pulse:    Resp:    Temp:    SpO2: 99% 99%    Last Pain:  Vitals:   10/27/17 1620  TempSrc:   PainSc: 3                  Annasofia Pohl S

## 2017-10-28 NOTE — Anesthesia Postprocedure Evaluation (Signed)
Anesthesia Post Note  Patient: Kale Rondeau  Procedure(s) Performed: RIGHT SIDED LUMBAR 5 SACRUM 1 MICRODISECTOMY.  TIME REQUESTED 2.5 HOURS (N/A )     Patient location during evaluation: PACU Anesthesia Type: General Level of consciousness: awake and alert Pain management: pain level controlled Vital Signs Assessment: post-procedure vital signs reviewed and stable Respiratory status: spontaneous breathing, nonlabored ventilation, respiratory function stable and patient connected to nasal cannula oxygen Cardiovascular status: blood pressure returned to baseline and stable Postop Assessment: no apparent nausea or vomiting Anesthetic complications: no    Last Vitals:  Vitals:   10/27/17 1718 10/27/17 1730  BP: (!) 115/58 (!) 113/53  Pulse:    Resp:    Temp:    SpO2: 99% 99%    Last Pain:  Vitals:   10/27/17 1620  TempSrc:   PainSc: 3                  Trevor Iha

## 2018-05-09 ENCOUNTER — Encounter: Payer: Self-pay | Admitting: Podiatry

## 2018-05-09 ENCOUNTER — Encounter

## 2018-05-09 ENCOUNTER — Ambulatory Visit (INDEPENDENT_AMBULATORY_CARE_PROVIDER_SITE_OTHER): Payer: BLUE CROSS/BLUE SHIELD

## 2018-05-09 ENCOUNTER — Ambulatory Visit (INDEPENDENT_AMBULATORY_CARE_PROVIDER_SITE_OTHER): Payer: BLUE CROSS/BLUE SHIELD | Admitting: Podiatry

## 2018-05-09 DIAGNOSIS — M109 Gout, unspecified: Secondary | ICD-10-CM | POA: Diagnosis not present

## 2018-05-09 DIAGNOSIS — M67471 Ganglion, right ankle and foot: Secondary | ICD-10-CM | POA: Diagnosis not present

## 2018-05-09 DIAGNOSIS — M779 Enthesopathy, unspecified: Secondary | ICD-10-CM

## 2018-05-09 DIAGNOSIS — M778 Other enthesopathies, not elsewhere classified: Secondary | ICD-10-CM

## 2018-05-09 MED ORDER — METHYLPREDNISOLONE 4 MG PO TBPK: ORAL_TABLET | ORAL | 0 refills | Status: AC

## 2018-05-09 NOTE — Progress Notes (Signed)
He presents today chief complaint of pain to the first metatarsophalangeal joint for the past few days.  Has been seeing his primary care doctor who did not perform any type of arthritic profile at this point.  States that it may be gout but is not sure that it is gout.  Is taking ibuprofen provided by the primary doctor which has helped some degree.  Objective: Vital signs are stable alert and oriented x3.  Pulses are palpable.  Red hot swollen first metatarsal phalangeal joint with soft area of fluctuance on the medial aspect..  Assessment: Probable ganglion cyst with gout.  Plan: Injected around the joint today with 20 mg Kenalog 5 mg Marcaine also put him on Sterapred Dosepak.  Also once the area was numb I aspirated the ganglion cyst which did demonstrate some tophi or crystals.    Should this patient calls stating that he needs blood work we will write him up requisition for a CBC as well as a uric acid level.  He will also need a sed rate with that.

## 2018-05-25 ENCOUNTER — Telehealth: Payer: Self-pay | Admitting: Podiatry

## 2018-05-25 ENCOUNTER — Ambulatory Visit (INDEPENDENT_AMBULATORY_CARE_PROVIDER_SITE_OTHER): Payer: BLUE CROSS/BLUE SHIELD | Admitting: Podiatry

## 2018-05-25 DIAGNOSIS — M109 Gout, unspecified: Secondary | ICD-10-CM

## 2018-05-25 DIAGNOSIS — M778 Other enthesopathies, not elsewhere classified: Secondary | ICD-10-CM

## 2018-05-25 DIAGNOSIS — M67471 Ganglion, right ankle and foot: Secondary | ICD-10-CM

## 2018-05-25 DIAGNOSIS — M779 Enthesopathy, unspecified: Secondary | ICD-10-CM | POA: Diagnosis not present

## 2018-05-25 NOTE — Telephone Encounter (Signed)
Pt was seen today for gout flare up, [facility] but they were unable to see the patient until Tuesday. Pt [place] [facility] [place] and they can see him sooner. Pt calling requesting that we send orders to [facility].

## 2018-05-25 NOTE — Telephone Encounter (Signed)
I informed pt the orders had been sent to the [facility] as requested.

## 2018-05-25 NOTE — Addendum Note (Signed)
Addended by: Alphia Kava'CONNELL, VALERY D on: 05/25/2018 02:29 PM   Modules accepted: Orders

## 2018-05-25 NOTE — Telephone Encounter (Signed)
Pt called again following up to see if the request for his MRI has been changed to [facility].

## 2018-05-25 NOTE — Telephone Encounter (Signed)
[facility]

## 2018-05-29 ENCOUNTER — Encounter: Payer: Self-pay | Admitting: Podiatry

## 2018-05-29 DIAGNOSIS — Z20828 Contact with and (suspected) exposure to other viral communicable diseases: Secondary | ICD-10-CM | POA: Insufficient documentation

## 2018-05-30 ENCOUNTER — Other Ambulatory Visit: Payer: BLUE CROSS/BLUE SHIELD

## 2018-05-30 ENCOUNTER — Ambulatory Visit (INDEPENDENT_AMBULATORY_CARE_PROVIDER_SITE_OTHER): Payer: BLUE CROSS/BLUE SHIELD | Admitting: Podiatry

## 2018-05-30 DIAGNOSIS — M67471 Ganglion, right ankle and foot: Secondary | ICD-10-CM | POA: Diagnosis not present

## 2018-05-30 NOTE — Progress Notes (Signed)
He presents today for 2-week follow-up on his gouty arthritis first metatarsophalangeal joint and soft tissue mass to the medial aspect of the foot.  Objective: Vital signs are stable he is alert and oriented x3.  Pulses are palpable.  Soft tissue mass to the medial aspect of the first metatarsal phalangeal joint had an ultrasound performed on it demonstrating only soft tissue mass.  Demonstrates some area of consolidation within the mass which very well could be bony or a tophus associated with gout.  Blood work has not returned from urgent care.  Assessment: Painful lesion medial aspect first metatarsophalangeal joint of the right foot.  Plan: At this point were going to take him to the operating room and remove this cyst and sent for pathologic evaluation.  We discussed the pros and cons of this and discussed this consent form today consisting of removal of painful soft tissue mass medial aspect first metatarsophalangeal joint.  We did discuss possible postop complications which may include but not limited to postop pain bleeding swelling infection recurrence need further surgery overcorrection under correction also gout attack and worsening of condition.  He signed a consent form and I will follow-up with him in the near future for surgical intervention he was provided with both oral and written restriction for the morning of surgery as well as the surgery center and anesthesia group.

## 2018-05-30 NOTE — Patient Instructions (Signed)
Pre-Operative Instructions  Congratulations, you have decided to take an important step towards improving your quality of life.  You can be assured that the doctors and staff at [facility] will be with you every step of the way.  Here are some important things you should know:  1. Plan to be at the surgery center/hospital at least 1 (one) hour prior to your scheduled time, unless otherwise directed by the surgical center/hospital staff.  You must have a responsible adult accompany you, remain during the surgery and drive you home.  Make sure you have directions to the surgical center/hospital to ensure you arrive on time. 2. If you are having surgery at [facility] or [facility] hospitals, you will need a copy of your medical history and physical form from your family physician within one month prior to the date of surgery. We will give you a form for your primary physician to complete.  3. We make every effort to accommodate the date you request for surgery.  However, there are times where surgery dates or times have to be moved.  We will contact you as soon as possible if a change in schedule is required.   4. No aspirin/ibuprofen for one week before surgery.  If you are on aspirin, any non-steroidal anti-inflammatory medications (Mobic, Aleve, Ibuprofen) should not be taken seven (7) days prior to your surgery.  You make take Tylenol for pain prior to surgery.  5. Medications - If you are taking daily heart and blood pressure medications, seizure, reflux, allergy, asthma, anxiety, pain or diabetes medications, make sure you notify the surgery center/hospital before the day of surgery so they can tell you which medications you should take or avoid the day of surgery. 6. No food or drink after midnight the night before surgery unless directed otherwise by surgical center/hospital staff. 7. No alcoholic beverages 24-hours prior to surgery.  No smoking 24-hours prior or 24-hours after  surgery. 8. Wear loose pants or shorts. They should be loose enough to fit over bandages, boots, and casts. 9. Don't wear slip-on shoes. Sneakers are preferred. 10. Bring your boot with you to the surgery center/hospital.  Also bring crutches or a walker if your physician has prescribed it for you.  If you do not have this equipment, it will be provided for you after surgery. 11. If you have not been contacted by the surgery center/hospital by the day before your surgery, call to confirm the date and time of your surgery. 12. Leave-time from work may vary depending on the type of surgery you have.  Appropriate arrangements should be made prior to surgery with your employer. 13. Prescriptions will be provided immediately following surgery by your doctor.  Fill these as soon as possible after surgery and take the medication as directed. Pain medications will not be refilled on weekends and must be approved by the doctor. 14. Remove nail polish on the operative foot and avoid getting pedicures prior to surgery. 15. Wash the night before surgery.  The night before surgery wash the foot and leg well with water and the antibacterial soap provided. Be sure to pay special attention to beneath the toenails and in between the toes.  Wash for at least three (3) minutes. Rinse thoroughly with water and dry well with a towel.  Perform this wash unless told not to do so by your physician.  Enclosed: 1 Ice pack (please put in freezer the night before surgery)   1 Hibiclens skin cleaner     Pre-op instructions  If you have any questions regarding the instructions, please do not hesitate to call our office.  [place]: [address], [place], [place] [address] -- [phone]  [place]: [address] [place], [place] [address] -- [phone]  [place]: [address]  [place], [place] [address] -- [phone]  [place]: [address], [address], [place], [place] [address] -- [phone]  Website:  [email or web address] 

## 2018-06-11 NOTE — Progress Notes (Signed)
  Subjective:  Patient ID: Jerry Henry, male    DOB: 17-Mar-1981,  MRN: [id number]  Chief Complaint  Patient presents with  . Gout    acute flare up - [facility] patient    38 y.o. male presents with the above complaint.  States that the right foot is hurting a lot more and would like to discuss possible surgical intervention.  Review of Systems: Negative except as noted in the HPI. Denies N/V/F/Ch.  Past Medical History:  Diagnosis Date  . Arthritis     Current Outpatient Medications:  .  azelastine (ASTELIN) 0.1 % nasal spray, , Disp: , Rfl:  .  cetirizine (ZYRTEC) 10 MG tablet, Take 5 mg by mouth at bedtime., Disp: , Rfl:  .  clobetasol cream (TEMOVATE) 0.05 %, APPLY A THIN LAYER TO THE AFFECTED AREA TWICE DAILY FOR 3 WEEKS, STOP 1 WEEK, REPEAT FOR FLARES., Disp: , Rfl: 0 .  Fiber, Guar Gum, CHEW, Chew 1 tablet by mouth daily., Disp: , Rfl:  .  methylPREDNISolone (MEDROL DOSEPAK) 4 MG TBPK tablet, 6 day dose pack - take as directed, Disp: 21 tablet, Rfl: 0 .  Multiple Vitamin (MULTIVITAMIN WITH MINERALS) TABS tablet, Take 1 tablet by mouth daily., Disp: , Rfl:  .  rosuvastatin (CRESTOR) 10 MG tablet, Take by mouth., Disp: , Rfl:   Social History   Tobacco Use  Smoking Status Never Smoker  Smokeless Tobacco Never Used    Allergies  Allergen Reactions  . Penicillins Hives    Has patient had a PCN reaction causing immediate rash, facial/tongue/throat swelling, SOB or lightheadedness with hypotension: No Had a PCN reaction causing SEVERE RASH involving MUCUS MEMEBRANES or SKIN NECROSIS: #  #  #  YES  #  #  #  Has patient had a PCN reaction that required hospitalization: Unknown Has patient had a PCN reaction occurring within the last 10 years: No    Objective:  There were no vitals filed for this visit. There is no height or weight on file to calculate BMI. Constitutional Well developed. Well nourished.  Vascular Dorsalis pedis pulses palpable bilaterally. Posterior tibial  pulses palpable bilaterally. Capillary refill normal to all digits.  No cyanosis or clubbing noted. Pedal hair growth normal.  Neurologic Normal speech. Oriented to person, place, and time. Epicritic sensation to light touch grossly present bilaterally.  Dermatologic Nails well groomed and normal in appearance. No open wounds. No skin lesions.  Orthopedic:  Pain palpation about the right first MPJ with no fluctuance but increased tissue density noted   Radiographs: None today Assessment:   1. Gout of right foot, unspecified cause, unspecified chronicity   2. Ganglion cyst of right foot   3. Capsulitis of right foot    Plan:  Patient was evaluated and treated and all questions answered.  Gout right foot, capsulitis, possible ganglion -Ultrasound ordered for further review -We will follow-up results and plan for possible removal  No follow-ups on file.

## 2018-06-20 ENCOUNTER — Other Ambulatory Visit: Payer: Self-pay | Admitting: Podiatry

## 2018-06-20 MED ORDER — ONDANSETRON HCL 4 MG PO TABS: 4.0000 mg | ORAL_TABLET | Freq: Three times a day (TID) | ORAL | 0 refills | Status: AC | PRN

## 2018-06-20 MED ORDER — CLINDAMYCIN HCL 150 MG PO CAPS: 150.0000 mg | ORAL_CAPSULE | Freq: Three times a day (TID) | ORAL | 0 refills | Status: AC

## 2018-06-20 MED ORDER — OXYCODONE-ACETAMINOPHEN 10-325 MG PO TABS: 1.0000 | ORAL_TABLET | Freq: Four times a day (QID) | ORAL | 0 refills | Status: AC | PRN

## 2018-06-23 ENCOUNTER — Encounter: Payer: Self-pay | Admitting: Podiatry

## 2018-06-23 DIAGNOSIS — M2011 Hallux valgus (acquired), right foot: Secondary | ICD-10-CM | POA: Diagnosis not present

## 2018-06-23 DIAGNOSIS — M67471 Ganglion, right ankle and foot: Secondary | ICD-10-CM | POA: Diagnosis not present

## 2018-06-29 ENCOUNTER — Ambulatory Visit (INDEPENDENT_AMBULATORY_CARE_PROVIDER_SITE_OTHER): Payer: Self-pay | Admitting: Podiatry

## 2018-06-29 DIAGNOSIS — Z09 Encounter for follow-up examination after completed treatment for conditions other than malignant neoplasm: Secondary | ICD-10-CM

## 2018-06-29 DIAGNOSIS — M67471 Ganglion, right ankle and foot: Secondary | ICD-10-CM

## 2018-06-29 NOTE — Progress Notes (Signed)
He presents today for his first postop visit date of surgery June 23, 2018 status post silver bunionectomy and excision of the ganglion.  He denies fever chills nausea vomiting muscle aches and pains states that it has not hurt a bit.  Objective: Vital signs are stable alert and oriented x3.  There is no erythema mild edema no cellulitis drainage or odor has great range of motion of the first metatarsophalangeal joint radiographs taken today demonstrate no complications whatsoever.  Assessment: Well-healing surgical foot.  Plan: Redressed today dressed a compressive dressing keep it dry and elevated form for another week follow-up with him at that time.

## 2018-07-06 ENCOUNTER — Ambulatory Visit (INDEPENDENT_AMBULATORY_CARE_PROVIDER_SITE_OTHER): Payer: Self-pay | Admitting: Podiatry

## 2018-07-06 DIAGNOSIS — Z09 Encounter for follow-up examination after completed treatment for conditions other than malignant neoplasm: Secondary | ICD-10-CM

## 2018-07-06 DIAGNOSIS — M67471 Ganglion, right ankle and foot: Secondary | ICD-10-CM

## 2018-07-06 NOTE — Progress Notes (Signed)
He presents today date of surgery June 23, 2018 status post excision ganglion and bunion deformity.  States that it itches a little bit but all in all it seems to be doing pretty well.  Objective: Vital signs are stable he is alert and oriented x3 there is minimal erythema no edema cellulitis drainage or odor sutures removed today margins remain well coapted.  Assessment: Well-healing surgical foot.  Plan: We will follow-up with him in 1 month and I am allow him to get back into tennis shoes over the next couple of weeks.  Until then he will remain in his [place] shoe.

## 2018-07-07 ENCOUNTER — Encounter: Payer: Self-pay | Admitting: Podiatry

## 2018-07-11 ENCOUNTER — Encounter: Payer: Self-pay | Admitting: Podiatry

## 2018-07-13 ENCOUNTER — Other Ambulatory Visit: Payer: BLUE CROSS/BLUE SHIELD

## 2018-07-18 ENCOUNTER — Other Ambulatory Visit: Payer: BLUE CROSS/BLUE SHIELD

## 2018-07-24 ENCOUNTER — Encounter: Payer: Self-pay | Admitting: Podiatry

## 2018-08-01 ENCOUNTER — Ambulatory Visit (INDEPENDENT_AMBULATORY_CARE_PROVIDER_SITE_OTHER): Payer: Self-pay | Admitting: Podiatry

## 2018-08-01 DIAGNOSIS — M67471 Ganglion, right ankle and foot: Secondary | ICD-10-CM

## 2018-08-01 DIAGNOSIS — Z09 Encounter for follow-up examination after completed treatment for conditions other than malignant neoplasm: Secondary | ICD-10-CM

## 2018-08-01 NOTE — Progress Notes (Signed)
He presents today for postop visit for excision ganglion and a silver bunionectomy states that unable to wear regular shoes and I have increased by walking.  He wants to know if he get back to his regular routine.  Objective: Vital signs are stable he is alert oriented x3 there is minimal erythema minimal edema no cellulitis drainage or odor.  Peers to be healing very nicely.  Assessment: Well-healing surgical foot.  Plan: Follow-up with me as needed.

## 2018-08-02 ENCOUNTER — Telehealth: Payer: Self-pay | Admitting: *Deleted

## 2018-08-02 NOTE — Telephone Encounter (Signed)
Left message for pt to call for results if Dr. Al Corpus had not discussed with him.

## 2018-08-02 NOTE — Telephone Encounter (Signed)
-----   Message from Elinor Parkinson, [place] sent at 07/31/2018  6:53 AM EST ----- Pathology report demonstrates a ganglion cyst.

## 2018-08-02 NOTE — Telephone Encounter (Signed)
I informed pt of Dr. Hyatt's review of results. 

## 2019-08-02 IMAGING — CR DG LUMBAR SPINE 2-3V
1 series · 1 of 1 positions shown · non-contrast
Comparison: None.

CLINICAL DATA: Microdiskectomy L5-S1

EXAM:
LUMBAR SPINE - 2-3 VIEW

[lateral]
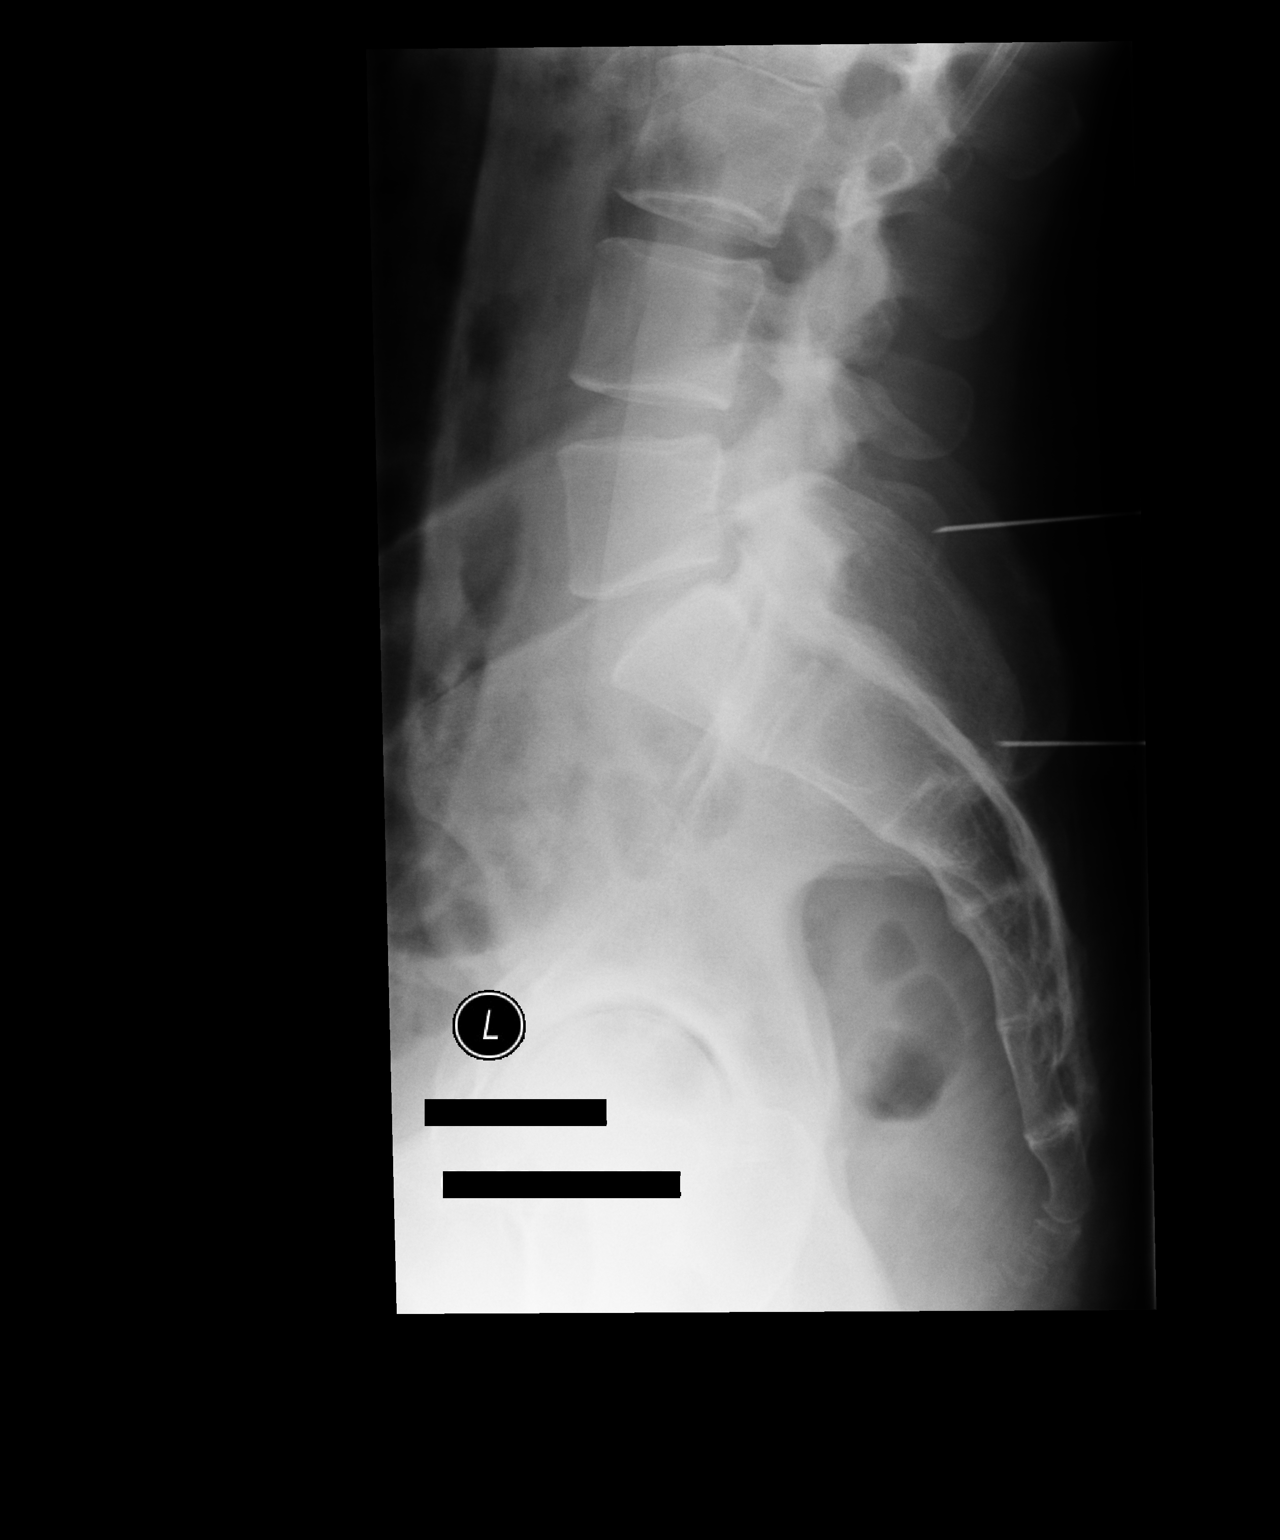

[1 of 1 positions shown; findings below may reference images not displayed]

FINDINGS: Cross-table lateral lumbar image time stamped [DATE] submitted.
Metallic probe tips are posterior to the L5-S1 interspace level and
the inferior aspect of S2. No fracture or spondylolisthesis.

A second cross-table lateral lumbar image time stamped [DATE] with
submitted. Metallic probe tip on this study is posterior to the
L5-S1 interspace. A second probe is posterior to S1-2. No fracture
or spondylolisthesis evident.
IMPRESSION: On the final submitted cross-table lateral lumbar image, metallic
probe tips are posterior to the L5-S1 and S1-2 interspaces. No
fracture or spondylolisthesis.

## 2019-08-04 ENCOUNTER — Ambulatory Visit: Payer: BC Managed Care – PPO | Attending: Internal Medicine

## 2019-08-04 DIAGNOSIS — Z23 Encounter for immunization: Secondary | ICD-10-CM | POA: Insufficient documentation

## 2019-08-04 NOTE — Progress Notes (Signed)
   Covid-19 Vaccination Clinic  Name:  Jerry Henry    MRN: [id number] DOB: [birth year]  08/04/2019  Mr. Voong was observed post Covid-19 immunization for 15 minutes without incident. He was provided with Vaccine Information Sheet and instruction to access the V-Safe system.   Mr. Mendonca was instructed to call 911 with any severe reactions post vaccine: Marland Kitchen Difficulty breathing  . Swelling of face and throat  . A fast heartbeat  . A bad rash all over body  . Dizziness and weakness   Immunizations Administered    Name Date Dose VIS Date Route   Pfizer COVID-19 Vaccine 08/04/2019  4:01 PM 0.3 mL 05/11/2019 Intramuscular   Manufacturer: [employer], [employer]   Lot: [id number]   NDC: [address]-9

## 2019-08-25 ENCOUNTER — Ambulatory Visit: Payer: BC Managed Care – PPO | Attending: Internal Medicine

## 2019-08-25 DIAGNOSIS — Z23 Encounter for immunization: Secondary | ICD-10-CM

## 2019-08-25 NOTE — Progress Notes (Signed)
   Covid-19 Vaccination Clinic  Name:  Jerry Henry    MRN: [id number] DOB: 1980/06/02  08/25/2019  Jerry Henry was observed post Covid-19 immunization for 15 minutes without incident. He was provided with Vaccine Information Sheet and instruction to access the V-Safe system.   Jerry Henry was instructed to call 911 with any severe reactions post vaccine: Marland Kitchen Difficulty breathing  . Swelling of face and throat  . A fast heartbeat  . A bad rash all over body  . Dizziness and weakness   Immunizations Administered    Name Date Dose VIS Date Route   Pfizer COVID-19 Vaccine 08/25/2019 10:52 AM 0.3 mL 05/11/2019 Intramuscular   Manufacturer: [employer], [employer]   Lot: [id number]   NDC: [address]-2

## 2019-09-04 ENCOUNTER — Ambulatory Visit: Payer: BC Managed Care – PPO

## 2024-04-12 ENCOUNTER — Ambulatory Visit (INDEPENDENT_AMBULATORY_CARE_PROVIDER_SITE_OTHER)

## 2024-04-12 ENCOUNTER — Ambulatory Visit (INDEPENDENT_AMBULATORY_CARE_PROVIDER_SITE_OTHER): Payer: Self-pay | Admitting: Podiatry

## 2024-04-12 DIAGNOSIS — M7752 Other enthesopathy of left foot: Secondary | ICD-10-CM

## 2024-04-12 DIAGNOSIS — M7672 Peroneal tendinitis, left leg: Secondary | ICD-10-CM | POA: Diagnosis not present

## 2024-04-12 MED ORDER — MELOXICAM 15 MG PO TABS: 15.0000 mg | ORAL_TABLET | Freq: Every day | ORAL | 3 refills | Status: AC

## 2024-04-12 MED ORDER — TRIAMCINOLONE ACETONIDE 40 MG/ML IJ SUSP
20.0000 mg | Freq: Once | INTRAMUSCULAR | Status: AC
  Administered 2024-04-12: 20 mg

## 2024-04-12 MED ORDER — METHYLPREDNISOLONE 4 MG PO TBPK: ORAL_TABLET | ORAL | 0 refills | Status: AC

## 2024-04-12 NOTE — Progress Notes (Signed)
 Zevin presents today chief concern of pain to the lateral aspect of his left heel.  He says his right below the ankle has been bothering me for the last 2 to 3 months stating that he is a runner he tried easing up and icing but really nothing has made a difference.  Objective: Vital signs stable alert and oriented x 3.  Pulses are palpable.  There is no erythema edema cellulitis drainage or odor.  He does have pain on palpation of the peroneal tubercle and the peroneus longus tendon.  Radiographs taken today demonstrate osseously mature individual some soft tissue swelling in the lateral aspect of the ankle and foot however there is no obvious osseous deformity.  Assessment: Peroneal tubercle pain with peroneus longus tendinitis probable tendinopathy cannot rule out a tendon tear at this point.  Plan: I injected the area today with 5 mg of Kenalog .  Start him on methylprednisolone  and meloxicam and recommended he discontinue exercising and cut back on his walking.  I will follow-up with him in 1 month if not improved MRI will be necessary.

## 2024-05-15 ENCOUNTER — Ambulatory Visit (INDEPENDENT_AMBULATORY_CARE_PROVIDER_SITE_OTHER): Admitting: Podiatry

## 2024-05-15 ENCOUNTER — Encounter: Payer: Self-pay | Admitting: Podiatry

## 2024-05-15 DIAGNOSIS — M7672 Peroneal tendinitis, left leg: Secondary | ICD-10-CM

## 2024-05-15 NOTE — Progress Notes (Signed)
 He presents today for follow-up of his peroneal tendinitis left.  He states he is doing much better about 90% he said I just get some shooting pains every now and again and particularly if I overdo it or if I sit wrong.  He is also questioning whether or not he may have a wart on the medial aspect of the second digit right foot or the plantar aspect of the hallux left.  Objective: Vital signs are stable alert oriented x 3 pulses are palpable.  There is no erythema edema cellulitis drainage odor he has mild fluctuance within the tendon sheath just inferior to the lateral malleolus.  No tenderness on range of motion or abduction against resistance.  He does have what appears to be a very small verrucoid lesion hallux left though this skin lines do not separate yet and I see no thrombosed capillaries.  I think this is just primordial.  The lesion on the medial aspect of the second digit right foot is not more than a benign skin lesion such as a callus.  Assessment: 90% improved peroneal tendinitis left foot.  Verruca plantaris hallux left benign skin lesion second digit right.  Plan: Discussed the use of a possible topical salicylic acid such as Compound W over-the-counter.  If this continues to enlarge or become painful he will notify us .  He will continue the progress with his peroneal tendons should these worsen MRI will be necessary [facility] him to slowly get started back to his exercise routine.

## 2024-06-01 ENCOUNTER — Encounter: Payer: Self-pay | Admitting: Podiatry

## 2024-06-01 ENCOUNTER — Telehealth: Payer: Self-pay | Admitting: Lab

## 2024-06-01 DIAGNOSIS — S86312A Strain of muscle(s) and tendon(s) of peroneal muscle group at lower leg level, left leg, initial encounter: Secondary | ICD-10-CM

## 2024-06-01 NOTE — Telephone Encounter (Signed)
 Patient called requesting MRI.

## 2024-06-11 ENCOUNTER — Ambulatory Visit

## 2024-06-11 DIAGNOSIS — S86312A Strain of muscle(s) and tendon(s) of peroneal muscle group at lower leg level, left leg, initial encounter: Secondary | ICD-10-CM | POA: Diagnosis not present

## 2024-06-12 ENCOUNTER — Ambulatory Visit: Payer: Self-pay | Admitting: Podiatry

## 2024-06-21 NOTE — Progress Notes (Signed)
 Patient informed scheduling appointment for follow up on MRI findings.

## 2024-07-12 ENCOUNTER — Ambulatory Visit: Admitting: Podiatry
